# Patient Record
Sex: Female | Born: 1950 | Race: Black or African American | Hispanic: No | Marital: Single | Smoking: Current every day smoker
Health system: Southern US, Community
[De-identification: ages and names within clinical notes are randomized; demographics above are authoritative.]

## PROBLEM LIST (undated history)

## (undated) DIAGNOSIS — M1990 Unspecified osteoarthritis, unspecified site: Secondary | ICD-10-CM

## (undated) HISTORY — DX: Unspecified osteoarthritis, unspecified site: M19.90

---

## 2010-10-14 DIAGNOSIS — E785 Hyperlipidemia, unspecified: Secondary | ICD-10-CM | POA: Insufficient documentation

## 2010-10-14 DIAGNOSIS — N6320 Unspecified lump in the left breast, unspecified quadrant: Secondary | ICD-10-CM | POA: Insufficient documentation

## 2018-08-28 ENCOUNTER — Emergency Department (HOSPITAL_COMMUNITY)
Admission: EM | Admit: 2018-08-28 | Discharge: 2018-08-28 | Disposition: A | Discharge status: Home / Self Care | Payer: Medicare Other | Attending: Emergency Medicine | Admitting: Emergency Medicine

## 2018-08-28 ENCOUNTER — Other Ambulatory Visit: Payer: Self-pay

## 2018-08-28 ENCOUNTER — Encounter (HOSPITAL_COMMUNITY): Payer: Self-pay

## 2018-08-28 DIAGNOSIS — F17210 Nicotine dependence, cigarettes, uncomplicated: Secondary | ICD-10-CM | POA: Insufficient documentation

## 2018-08-28 DIAGNOSIS — M25432 Effusion, left wrist: Secondary | ICD-10-CM | POA: Diagnosis present

## 2018-08-28 MED ORDER — PREDNISONE 10 MG PO TABS: 20.0000 mg | ORAL_TABLET | Freq: Every day | ORAL | 0 refills | Status: DC

## 2018-08-28 NOTE — ED Provider Notes (Signed)
[facility] DEPT Provider Note   CSN: [id number] Arrival date & time: 08/28/18  8469     History   Chief Complaint Chief Complaint  Patient presents with  . Arm Pain    HPI Baby Gieger is a 68 y.o. female.     The history is provided by the patient. No language interpreter was used.  Arm Pain   Lezly Rumpf is a 68 y.o. female who presents to the Emergency Department complaining of wrist swelling and pain. She presents for evaluation of one week of left wrist swelling and pain. Pain has significantly worsened over the last 24 hours. She is right-hand dominant. She denies any injuries to the wrist. She has decreased range of motion difficulty using her hand secondary to swelling in the wrist. She denies any fevers, chills, shortness of breath, nausea, vomiting, abdominal pain. No prior similar symptoms. She has taken ibuprofen at home with partial improvement in her symptoms. History reviewed. No pertinent past medical history.  There are no active problems to display for this patient.   History reviewed. No pertinent surgical history.   OB History   No obstetric history on file.      Home Medications    Prior to Admission medications   Medication Sig Start Date End Date Taking? Authorizing Provider  predniSONE (DELTASONE) 10 MG tablet Take 2 tablets (20 mg total) by mouth daily. 08/28/18   Quintella Reichert, MD    Family History History reviewed. No pertinent family history.  Social History Social History   Tobacco Use  . Smoking status: Current Every Day Smoker    Packs/day: 0.50    Types: Cigarettes  . Smokeless tobacco: Never Used  . Tobacco comment: 3 packs a week   Substance Use Topics  . Alcohol use: Not Currently  . Drug use: Never     Allergies   Patient has no allergy information on record.   Review of Systems Review of Systems  All other systems reviewed and are negative.    Physical Exam Updated Vital Signs BP  114/77 (BP Location: Right Arm)   Pulse 95   Temp 99 F (37.2 C) (Oral)   Resp 17   SpO2 99%   Physical Exam Vitals signs and nursing note reviewed.  Constitutional:      Appearance: She is well-developed.  HENT:     Head: Normocephalic and atraumatic.  Cardiovascular:     Rate and Rhythm: Normal rate and regular rhythm.     Heart sounds: No murmur.  Pulmonary:     Effort: Pulmonary effort is normal. No respiratory distress.     Breath sounds: Normal breath sounds.  Musculoskeletal:     Comments: 2+ radial pulses bilaterally. There is mild to moderate edema and tenderness to the left wrist with decreased range of motion secondary to pain and swelling. There is mild swelling to the left second and third MCP joints with decreased range of motion. There is no overlying erythema. There is normal range of motion throughout the elbow and shoulder of the left upper extremity.  Skin:    General: Skin is warm and dry.  Neurological:     Mental Status: She is alert and oriented to person, place, and time.  Psychiatric:        Behavior: Behavior normal.      ED Treatments / Results  Labs (all labs ordered are listed, but only abnormal results are displayed) Labs Reviewed - No data to display  EKG  None  Radiology No results found.  Procedures Procedures (including critical care time)  Medications Ordered in ED Medications - No data to display   Initial Impression / Assessment and Plan / ED Course  I have reviewed the triage vital signs and the nursing notes.  Pertinent labs & imaging results that were available during my care of the patient were reviewed by me and considered in my medical decision making (see chart for details).        Patient here for evaluation of pain and swelling to the left wrist. She is well appearing on evaluation. No evidence of active infection, gouty arthritis, trauma. She does have edema decreased range of motion concerning for inflammatory  arthropathy. Will treat with a splint for comfort as well as a low dose of steroid course in anti-inflammatories. Counseled patient on home care, outpatient follow-up and return precautions.  Final Clinical Impressions(s) / ED Diagnoses   Final diagnoses:  Wrist swelling, left    ED Discharge Orders         Ordered    predniSONE (DELTASONE) 10 MG tablet  Daily     08/28/18 0737           Tilden Fossaees, [place], MD 08/28/18 [phone]

## 2018-08-28 NOTE — ED Triage Notes (Addendum)
Pt c/o left lower arm pain since Saturday and has progressively gotten worse.   Denies falling, injury, or trauma.   Pt states pain woke her up this morning at 2am 10/10 pain   Pain in triage is a 1.   Pain is worse with activity.   A/Ox4 Ambulatory in triage.

## 2018-10-03 ENCOUNTER — Other Ambulatory Visit: Payer: Self-pay | Admitting: Endocrinology

## 2018-10-03 DIAGNOSIS — Z1231 Encounter for screening mammogram for malignant neoplasm of breast: Secondary | ICD-10-CM

## 2018-11-20 ENCOUNTER — Other Ambulatory Visit: Payer: Self-pay

## 2018-11-20 ENCOUNTER — Ambulatory Visit
Admission: RE | Admit: 2018-11-20 | Discharge: 2018-11-20 | Disposition: A | Discharge status: Home / Self Care | Payer: Medicare Other | Source: Ambulatory Visit | Attending: Endocrinology | Admitting: Endocrinology

## 2018-11-20 DIAGNOSIS — Z1231 Encounter for screening mammogram for malignant neoplasm of breast: Secondary | ICD-10-CM

## 2019-09-24 ENCOUNTER — Other Ambulatory Visit: Payer: Self-pay | Admitting: Family Medicine

## 2019-09-24 DIAGNOSIS — M8580 Other specified disorders of bone density and structure, unspecified site: Secondary | ICD-10-CM | POA: Insufficient documentation

## 2019-09-24 DIAGNOSIS — E559 Vitamin D deficiency, unspecified: Secondary | ICD-10-CM | POA: Insufficient documentation

## 2019-09-24 DIAGNOSIS — E538 Deficiency of other specified B group vitamins: Secondary | ICD-10-CM | POA: Insufficient documentation

## 2019-09-24 DIAGNOSIS — Z122 Encounter for screening for malignant neoplasm of respiratory organs: Secondary | ICD-10-CM

## 2019-09-24 DIAGNOSIS — Z87891 Personal history of nicotine dependence: Secondary | ICD-10-CM

## 2019-09-24 DIAGNOSIS — L293 Anogenital pruritus, unspecified: Secondary | ICD-10-CM | POA: Insufficient documentation

## 2019-10-01 DIAGNOSIS — N1831 Chronic kidney disease, stage 3a: Secondary | ICD-10-CM | POA: Insufficient documentation

## 2019-10-07 ENCOUNTER — Other Ambulatory Visit: Payer: Self-pay

## 2019-10-07 ENCOUNTER — Ambulatory Visit
Admission: RE | Admit: 2019-10-07 | Discharge: 2019-10-07 | Disposition: A | Discharge status: Home / Self Care | Payer: Medicare Other | Source: Ambulatory Visit | Attending: Family Medicine | Admitting: Family Medicine

## 2019-10-07 DIAGNOSIS — Z122 Encounter for screening for malignant neoplasm of respiratory organs: Secondary | ICD-10-CM

## 2019-10-07 DIAGNOSIS — Z87891 Personal history of nicotine dependence: Secondary | ICD-10-CM

## 2019-10-29 DIAGNOSIS — F330 Major depressive disorder, recurrent, mild: Secondary | ICD-10-CM | POA: Insufficient documentation

## 2020-01-28 DIAGNOSIS — L853 Xerosis cutis: Secondary | ICD-10-CM | POA: Insufficient documentation

## 2020-01-28 DIAGNOSIS — M170 Bilateral primary osteoarthritis of knee: Secondary | ICD-10-CM | POA: Insufficient documentation

## 2020-12-02 ENCOUNTER — Other Ambulatory Visit: Payer: Self-pay | Admitting: Family Medicine

## 2020-12-02 DIAGNOSIS — Z122 Encounter for screening for malignant neoplasm of respiratory organs: Secondary | ICD-10-CM

## 2020-12-02 DIAGNOSIS — Z87891 Personal history of nicotine dependence: Secondary | ICD-10-CM

## 2020-12-03 ENCOUNTER — Other Ambulatory Visit: Payer: Self-pay | Admitting: Family Medicine

## 2020-12-03 DIAGNOSIS — Z13820 Encounter for screening for osteoporosis: Secondary | ICD-10-CM

## 2020-12-29 ENCOUNTER — Other Ambulatory Visit: Payer: Self-pay

## 2020-12-29 ENCOUNTER — Ambulatory Visit
Admission: RE | Admit: 2020-12-29 | Discharge: 2020-12-29 | Disposition: A | Discharge status: Home / Self Care | Payer: Medicare Other | Source: Ambulatory Visit | Attending: Family Medicine | Admitting: Family Medicine

## 2020-12-29 DIAGNOSIS — Z122 Encounter for screening for malignant neoplasm of respiratory organs: Secondary | ICD-10-CM

## 2020-12-29 DIAGNOSIS — Z87891 Personal history of nicotine dependence: Secondary | ICD-10-CM

## 2020-12-30 DIAGNOSIS — I700 Atherosclerosis of aorta: Secondary | ICD-10-CM | POA: Insufficient documentation

## 2020-12-30 DIAGNOSIS — I2510 Atherosclerotic heart disease of native coronary artery without angina pectoris: Secondary | ICD-10-CM | POA: Insufficient documentation

## 2020-12-31 DIAGNOSIS — R918 Other nonspecific abnormal finding of lung field: Secondary | ICD-10-CM | POA: Insufficient documentation

## 2021-03-22 ENCOUNTER — Other Ambulatory Visit: Payer: Self-pay | Admitting: Internal Medicine

## 2021-03-22 DIAGNOSIS — Z1231 Encounter for screening mammogram for malignant neoplasm of breast: Secondary | ICD-10-CM

## 2021-04-15 ENCOUNTER — Ambulatory Visit
Admission: RE | Admit: 2021-04-15 | Discharge: 2021-04-15 | Disposition: A | Discharge status: Home / Self Care | Payer: Medicare Other | Source: Ambulatory Visit | Attending: Internal Medicine | Admitting: Internal Medicine

## 2021-04-15 DIAGNOSIS — Z1231 Encounter for screening mammogram for malignant neoplasm of breast: Secondary | ICD-10-CM

## 2021-05-14 ENCOUNTER — Ambulatory Visit
Admission: RE | Admit: 2021-05-14 | Discharge: 2021-05-14 | Disposition: A | Discharge status: Home / Self Care | Payer: Medicare Other | Source: Ambulatory Visit | Attending: Family Medicine | Admitting: Family Medicine

## 2021-05-14 DIAGNOSIS — Z13820 Encounter for screening for osteoporosis: Secondary | ICD-10-CM

## 2021-05-18 DIAGNOSIS — K219 Gastro-esophageal reflux disease without esophagitis: Secondary | ICD-10-CM | POA: Insufficient documentation

## 2021-06-03 ENCOUNTER — Other Ambulatory Visit: Payer: Self-pay | Admitting: Internal Medicine

## 2021-06-03 DIAGNOSIS — R918 Other nonspecific abnormal finding of lung field: Secondary | ICD-10-CM

## 2021-06-03 DIAGNOSIS — Z122 Encounter for screening for malignant neoplasm of respiratory organs: Secondary | ICD-10-CM

## 2021-07-05 ENCOUNTER — Other Ambulatory Visit: Payer: Self-pay | Admitting: Physician Assistant

## 2021-07-08 ENCOUNTER — Ambulatory Visit
Admission: RE | Admit: 2021-07-08 | Discharge: 2021-07-08 | Disposition: A | Discharge status: Home / Self Care | Payer: Medicare Other | Source: Ambulatory Visit | Attending: Internal Medicine | Admitting: Internal Medicine

## 2021-07-08 ENCOUNTER — Other Ambulatory Visit: Payer: Self-pay | Admitting: Internal Medicine

## 2021-07-08 DIAGNOSIS — R918 Other nonspecific abnormal finding of lung field: Secondary | ICD-10-CM

## 2021-07-08 DIAGNOSIS — Z122 Encounter for screening for malignant neoplasm of respiratory organs: Secondary | ICD-10-CM

## 2022-07-18 ENCOUNTER — Other Ambulatory Visit: Payer: Self-pay | Admitting: Geriatric Medicine

## 2022-07-18 DIAGNOSIS — Z122 Encounter for screening for malignant neoplasm of respiratory organs: Secondary | ICD-10-CM

## 2022-07-18 DIAGNOSIS — Z87891 Personal history of nicotine dependence: Secondary | ICD-10-CM

## 2022-08-10 ENCOUNTER — Ambulatory Visit
Admission: RE | Admit: 2022-08-10 | Discharge: 2022-08-10 | Disposition: A | Discharge status: Home / Self Care | Payer: Medicare Other | Source: Ambulatory Visit | Attending: Geriatric Medicine | Admitting: Geriatric Medicine

## 2022-08-10 DIAGNOSIS — Z87891 Personal history of nicotine dependence: Secondary | ICD-10-CM

## 2022-08-10 DIAGNOSIS — Z122 Encounter for screening for malignant neoplasm of respiratory organs: Secondary | ICD-10-CM

## 2022-08-16 DIAGNOSIS — J432 Centrilobular emphysema: Secondary | ICD-10-CM | POA: Insufficient documentation

## 2022-08-16 DIAGNOSIS — J479 Bronchiectasis, uncomplicated: Secondary | ICD-10-CM | POA: Insufficient documentation

## 2023-07-14 ENCOUNTER — Encounter: Payer: Self-pay | Admitting: Family Medicine

## 2023-07-14 ENCOUNTER — Ambulatory Visit: Admitting: Family Medicine

## 2023-07-14 VITALS — BP 102/72 | HR 88 | Temp 97.8°F | Ht 65.0 in | Wt 190.0 lb

## 2023-07-14 DIAGNOSIS — I2510 Atherosclerotic heart disease of native coronary artery without angina pectoris: Secondary | ICD-10-CM

## 2023-07-14 DIAGNOSIS — N1831 Chronic kidney disease, stage 3a: Secondary | ICD-10-CM

## 2023-07-14 DIAGNOSIS — R7303 Prediabetes: Secondary | ICD-10-CM | POA: Diagnosis not present

## 2023-07-14 DIAGNOSIS — E785 Hyperlipidemia, unspecified: Secondary | ICD-10-CM

## 2023-07-14 DIAGNOSIS — I700 Atherosclerosis of aorta: Secondary | ICD-10-CM

## 2023-07-14 DIAGNOSIS — E559 Vitamin D deficiency, unspecified: Secondary | ICD-10-CM

## 2023-07-14 DIAGNOSIS — R011 Cardiac murmur, unspecified: Secondary | ICD-10-CM

## 2023-07-14 DIAGNOSIS — F17200 Nicotine dependence, unspecified, uncomplicated: Secondary | ICD-10-CM

## 2023-07-14 DIAGNOSIS — R252 Cramp and spasm: Secondary | ICD-10-CM

## 2023-07-14 DIAGNOSIS — J432 Centrilobular emphysema: Secondary | ICD-10-CM

## 2023-07-14 DIAGNOSIS — G8929 Other chronic pain: Secondary | ICD-10-CM

## 2023-07-14 LAB — COMPREHENSIVE METABOLIC PANEL WITH GFR
ALT: 16 U/L (ref 0–35)
AST: 20 U/L (ref 0–37)
Albumin: 4.3 g/dL (ref 3.5–5.2)
Alkaline Phosphatase: 73 U/L (ref 39–117)
BUN: 8 mg/dL (ref 6–23)
CO2: 28 meq/L (ref 19–32)
Calcium: 10 mg/dL (ref 8.4–10.5)
Chloride: 107 meq/L (ref 96–112)
Creatinine, Ser: 1.15 mg/dL (ref 0.40–1.20)
GFR: 47.28 mL/min — ABNORMAL LOW (ref >60.00)
Glucose, Bld: 93 mg/dL (ref 70–99)
Potassium: 4.3 meq/L (ref 3.5–5.1)
Sodium: 142 meq/L (ref 135–145)
Total Bilirubin: 0.5 mg/dL (ref 0.2–1.2)
Total Protein: 7.2 g/dL (ref 6.0–8.3)

## 2023-07-14 LAB — VITAMIN D 25 HYDROXY (VIT D DEFICIENCY, FRACTURES): VITD: 47.03 ng/mL (ref 30.00–100.00)

## 2023-07-14 LAB — CBC WITH DIFFERENTIAL/PLATELET
Basophils Absolute: 0.1 K/uL (ref 0.0–0.1)
Basophils Relative: 0.6 % (ref 0.0–3.0)
Eosinophils Absolute: 0.1 K/uL (ref 0.0–0.7)
Eosinophils Relative: 0.7 % (ref 0.0–5.0)
HCT: 44.8 % (ref 36.0–46.0)
Hemoglobin: 14.4 g/dL (ref 12.0–15.0)
Lymphocytes Relative: 28.2 % (ref 12.0–46.0)
Lymphs Abs: 2.5 K/uL (ref 0.7–4.0)
MCHC: 32.2 g/dL (ref 30.0–36.0)
MCV: 87.1 fl (ref 78.0–100.0)
Monocytes Absolute: 0.5 K/uL (ref 0.1–1.0)
Monocytes Relative: 5.5 % (ref 3.0–12.0)
Neutro Abs: 5.8 K/uL (ref 1.4–7.7)
Neutrophils Relative %: 65 % (ref 43.0–77.0)
Platelets: 231 K/uL (ref 150.0–400.0)
RBC: 5.14 Mil/uL — ABNORMAL HIGH (ref 3.87–5.11)
RDW: 14.7 % (ref 11.5–15.5)
WBC: 9 K/uL (ref 4.0–10.5)

## 2023-07-14 LAB — TSH: TSH: 1.93 u[IU]/mL (ref 0.35–5.50)

## 2023-07-14 LAB — HEMOGLOBIN A1C: Hgb A1c MFr Bld: 6.2 % (ref 4.6–6.5)

## 2023-07-14 LAB — FERRITIN: Ferritin: 91.8 ng/mL (ref 10.0–291.0)

## 2023-07-14 LAB — LIPID PANEL
Cholesterol: 161 mg/dL (ref 0–200)
HDL: 45 mg/dL (ref >39.00)
LDL Cholesterol: 90 mg/dL (ref 0–99)
NonHDL: 115.84
Total CHOL/HDL Ratio: 4
Triglycerides: 129 mg/dL (ref 0.0–149.0)
VLDL: 25.8 mg/dL (ref 0.0–40.0)

## 2023-07-14 LAB — T4, FREE: Free T4: 0.93 ng/dL (ref 0.60–1.60)

## 2023-07-14 LAB — VITAMIN B12: Vitamin B-12: 291 pg/mL (ref 211–911)

## 2023-07-14 NOTE — Patient Instructions (Signed)
 Please go downstairs for labs before you leave.   We will be in touch with your results.

## 2023-07-14 NOTE — Progress Notes (Unsigned)
 New Patient Office Visit  Subjective    Patient ID: Samantha Ward, female    DOB: [birth year]  Age: 73 y.o. MRN: [id number]  CC:  Chief Complaint  Patient presents with   Establish Care    Hx of knee pain, uses tylenol and Voltaren gel Hx of on and off vaginal itching. Uses gel recommended by provider.    HPI PHARRAH ROTTMAN presents to establish care Previous PCP - [facility] since 2020 and they closed May 2025.   Moved here from [place] in 2019.  She was going to [place] prior to [facility].   Prediabetes   Chronic right knee pain - takes Tylenol some days. Topical Voltaren gel   Emphysema. Smokes.  Smoked since HS. Usually only smokes when she is at home.   Depression- prays, reads her bible and listens to music.   Taking Crestor and is fasting today   Lives alone. She drives.  Divorced.  Has a son in [place]   No grandchildren  Has nieces and nephews       07/14/2023    1:08 PM  Depression screen PHQ 2/9  Decreased Interest 0  Down, Depressed, Hopeless 0  PHQ - 2 Score 0      Outpatient Encounter Medications as of 07/14/2023  Medication Sig   Cholecalciferol (VITAMIN D -3 PO) Take by mouth.   diclofenac Sodium (VOLTAREN ARTHRITIS PAIN) 1 % GEL Apply topically 4 (four) times daily.   FIBER PO Take by mouth.   MAGNESIUM PO Take by mouth.   Omega-3 Fatty Acids (OMEGA 3 PO) Take by mouth.   rosuvastatin (CRESTOR) 20 MG tablet rosuvastatin 20 mg tabs   [DISCONTINUED] predniSONE  (DELTASONE ) 10 MG tablet Take 2 tablets (20 mg total) by mouth daily.   No facility-administered encounter medications on file as of 07/14/2023.    Past Medical History:  Diagnosis Date   Arthritis    Left breast mass 10/14/2010   Mammogram normal 11/19/10.     Recurrent major depressive episodes, mild (HCC) 10/29/2019    History reviewed. No pertinent surgical history.  Family History  Problem Relation Age of Onset   Heart disease Mother    Drug abuse Sister     Social  History   Socioeconomic History   Marital status: Single    Spouse name: Not on file   Number of children: Not on file   Years of education: Not on file   Highest education level: Associate degree: academic program  Occupational History   Not on file  Tobacco Use   Smoking status: Some Days    Current packs/day: 0.50    Average packs/day: 0.5 packs/day for 60.0 years (30.0 ttl pk-yrs)    Types: Cigarettes   Smokeless tobacco: Never   Tobacco comments:    Not everyday  occurrence, sometimes can go three weeks without having a cigarette  Substance and Sexual Activity   Alcohol use: Never   Drug use: Never   Sexual activity: Not Currently    Birth control/protection: None  Other Topics Concern   Not on file  Social History Narrative   Not on file   Social Drivers of Health   Financial Resource Strain: Low Risk  (07/10/2023)   Overall Financial Resource Strain (CARDIA)    Difficulty of Paying Living Expenses: Not very hard  Food Insecurity: Food Insecurity Present (07/10/2023)   Hunger Vital Sign    Worried About Running Out of Food in the Last Year: Sometimes true  Ran Out of Food in the Last Year: Sometimes true  Transportation Needs: Patient Declined (07/10/2023)   PRAPARE - Transportation    Lack of Transportation (Medical): Patient declined    Lack of Transportation (Non-Medical): Patient declined  Physical Activity: Unknown (07/10/2023)   Exercise Vital Sign    Days of Exercise per Week: Patient declined    Minutes of Exercise per Session: Not on file  Stress: No Stress Concern Present (07/10/2023)   [employer] of Occupational Health - Occupational Stress Questionnaire    Feeling of Stress: Only a little  Social Connections: Unknown (07/10/2023)   Social Connection and Isolation Panel    Frequency of Communication with Friends and Family: Twice a week    Frequency of Social Gatherings with Friends and Family: Patient declined    Attends Religious Services: Patient  declined    Database administrator or Organizations: Patient declined    Attends Engineer, structural: Not on file    Marital Status: Patient declined  Intimate Partner Violence: Not on file    Review of Systems  Constitutional:  Negative for chills and fever.  Eyes:  Negative for blurred vision, double vision and photophobia.  Respiratory:  Negative for shortness of breath.   Cardiovascular:  Negative for chest pain, palpitations and leg swelling.  Gastrointestinal:  Negative for abdominal pain, constipation, diarrhea, nausea and vomiting.  Genitourinary:  Negative for dysuria, frequency and urgency.  Musculoskeletal:  Negative for falls.  Neurological:  Negative for dizziness, focal weakness and headaches.  Psychiatric/Behavioral:  Negative for depression and substance abuse. The patient is not nervous/anxious.         Objective    BP 102/72 (BP Location: Left Arm, Patient Position: Sitting)   Pulse 88   Temp 97.8 F (36.6 C) (Temporal)   Ht 5' 5 (1.651 m)   Wt 190 lb (86.2 kg)   SpO2 98%   BMI 31.62 kg/m   Physical Exam Constitutional:      General: She is not in acute distress.    Appearance: She is not ill-appearing.  HENT:     Mouth/Throat:     Mouth: Mucous membranes are moist.     Pharynx: Oropharynx is clear.  Eyes:     Extraocular Movements: Extraocular movements intact.     Conjunctiva/sclera: Conjunctivae normal.  Cardiovascular:     Rate and Rhythm: Normal rate and regular rhythm.     Heart sounds: Murmur heard.  Pulmonary:     Effort: Pulmonary effort is normal.     Breath sounds: Normal breath sounds.  Musculoskeletal:     Cervical back: Normal range of motion and neck supple.     Right lower leg: No edema.     Left lower leg: No edema.  Skin:    General: Skin is warm and dry.  Neurological:     General: No focal deficit present.     Mental Status: She is alert and oriented to person, place, and time.     Cranial Nerves: No cranial  nerve deficit.     Motor: No weakness.     Coordination: Coordination normal.     Gait: Gait normal.  Psychiatric:        Mood and Affect: Mood normal.        Behavior: Behavior normal.        Thought Content: Thought content normal.         Assessment & Plan:   Problem List Items Addressed This Visit  Hyperlipidemia (Chronic)   Relevant Medications   rosuvastatin (CRESTOR) 20 MG tablet   Other Relevant Orders   Lipid panel (Completed)   Calcification of coronary artery   Relevant Medications   rosuvastatin (CRESTOR) 20 MG tablet   Other Relevant Orders   Lipid panel (Completed)   Stage 3a chronic kidney disease (HCC)   Relevant Orders   Comprehensive metabolic panel with GFR (Completed)   TSH (Completed)   T4, free (Completed)   Vitamin D  deficiency   Relevant Orders   VITAMIN D  25 Hydroxy (Vit-D Deficiency, Fractures) (Completed)   Other Visit Diagnoses       Prediabetes    -  Primary   Relevant Orders   CBC with Differential/Platelet (Completed)   Comprehensive metabolic panel with GFR (Completed)   TSH (Completed)   T4, free (Completed)   Hemoglobin A1c (Completed)     Centrilobular emphysema (HCC)         Chronic right hip pain         Chronic pain of right knee         Atherosclerosis of aorta (HCC)       Relevant Medications   rosuvastatin (CRESTOR) 20 MG tablet   Other Relevant Orders   Lipid panel (Completed)     Muscle cramps       Relevant Orders   CBC with Differential/Platelet (Completed)   Comprehensive metabolic panel with GFR (Completed)   TSH (Completed)   T4, free (Completed)   Vitamin B12 (Completed)   Ferritin (Completed)     Smoker         Heart murmur          She is here to establish care.  Previous medical care at [facility] in [place] prior to that. Reviewed list of chronic health conditions and medications.  Reports taking statin therapy.  Unclear if murmur is ongoing or not.  Order echocardiogram if I am able to get  records and no recent echo on file. Encouraged smoking cessation. Check labs to look for underlying etiology for muscle cramps as well as to follow-up on chronic health conditions such as prediabetes, CKD, vitamin D  deficiency and hyperlipidemia.  Follow-up in 4 weeks     Return in about 4 weeks (around 08/11/2023) for chronic health conditions.   Boby Mackintosh, NP-C

## 2023-07-17 ENCOUNTER — Ambulatory Visit: Payer: Self-pay | Admitting: Family Medicine

## 2023-08-10 IMAGING — MG MM DIGITAL SCREENING BILAT W/ TOMO AND CAD
6 of 10 series · 6 of 30 positions shown · non-contrast
Comparison: Previous exam(s).

CLINICAL DATA: Screening.

EXAM:
DIGITAL SCREENING BILATERAL MAMMOGRAM WITH TOMOSYNTHESIS AND CAD
TECHNIQUE: Bilateral screening digital craniocaudal and mediolateral oblique
mammograms were obtained. Bilateral screening digital breast
tomosynthesis was performed. The images were evaluated with
computer-aided detection.

[R CC synth-2D]
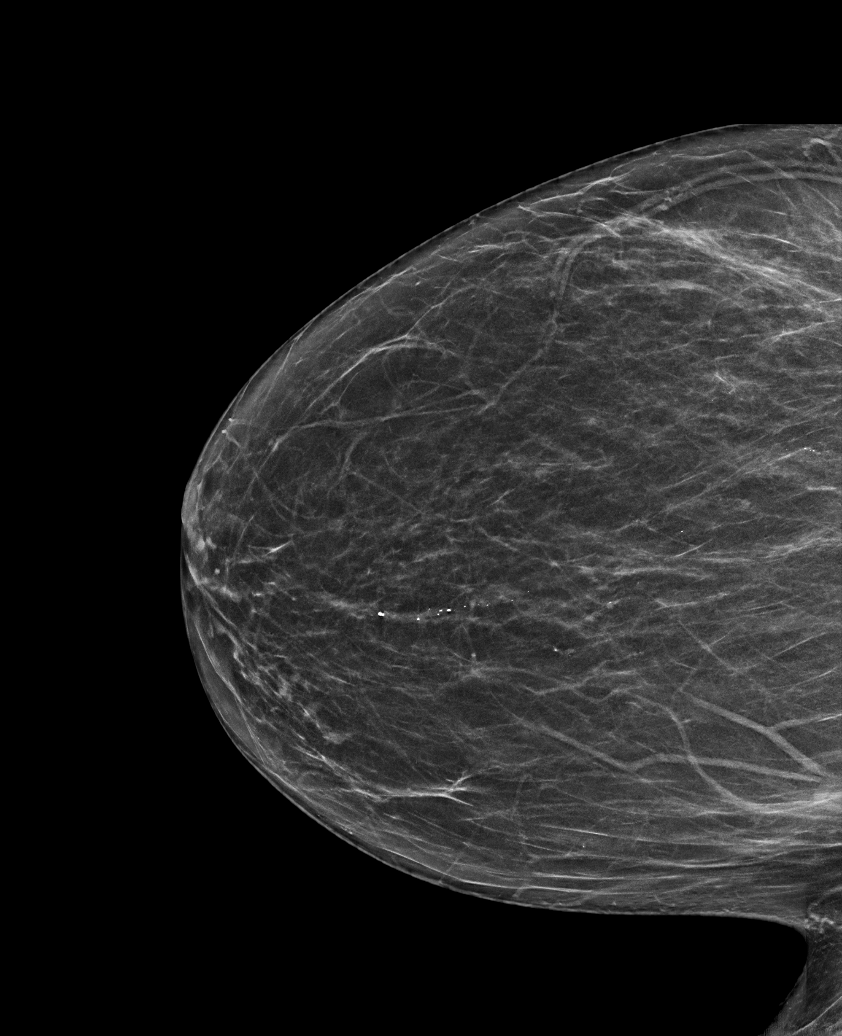

[L MLO synth-2D]
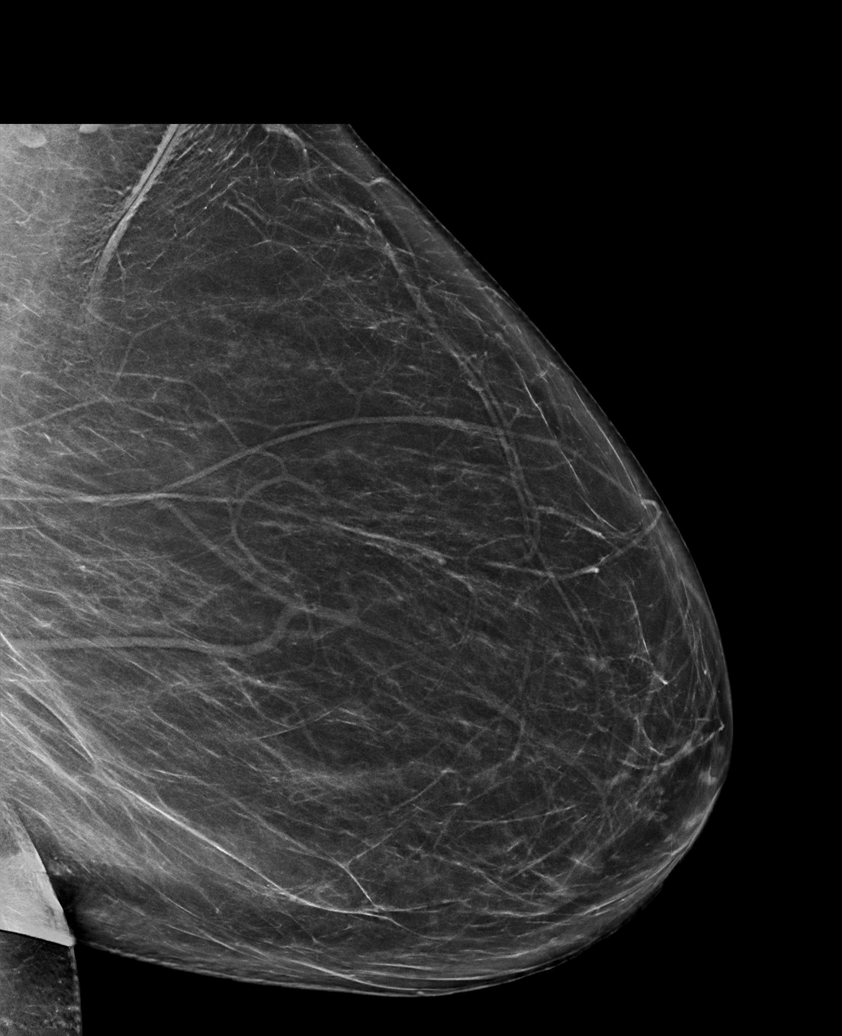

[R MLO synth-2D (1 of 2)]
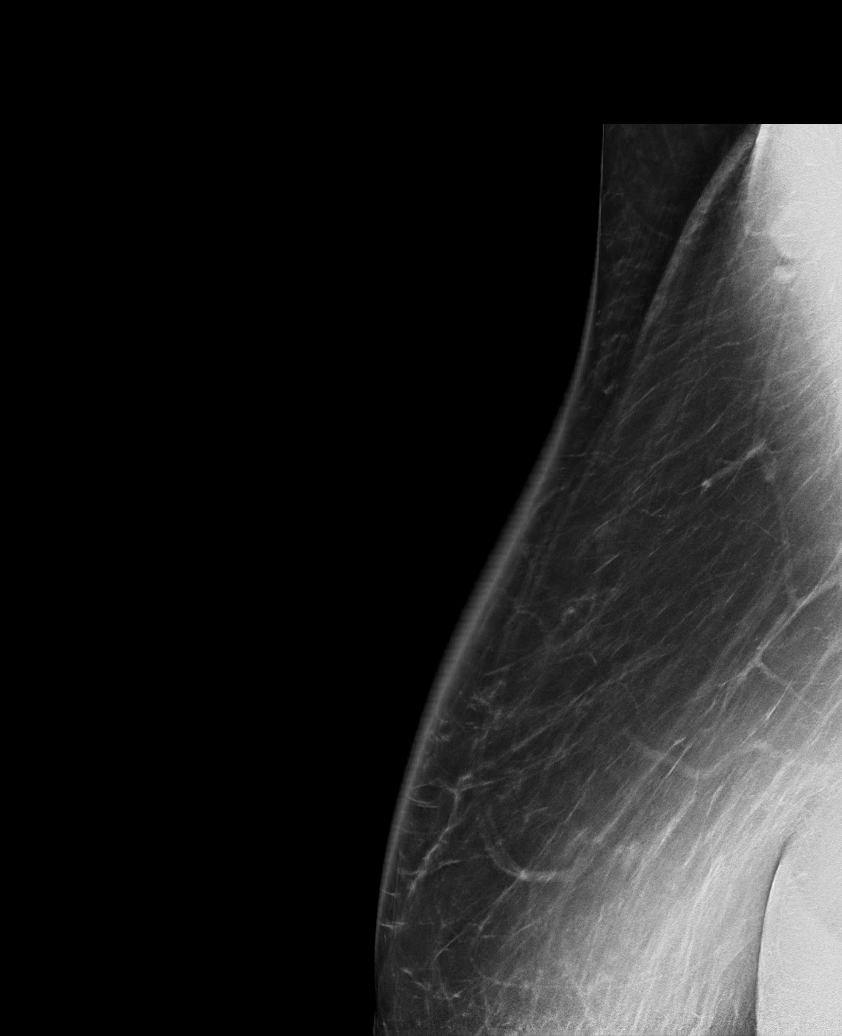

[L CC synth-2D]
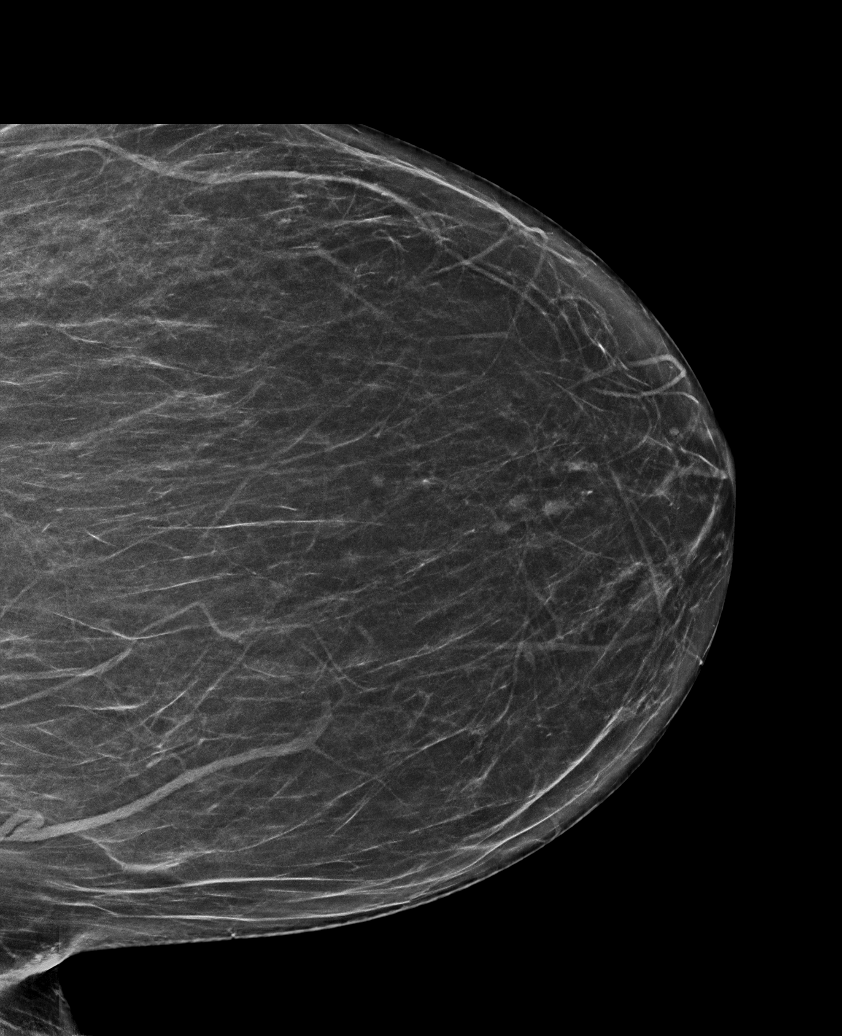

[R MLO synth-2D (2 of 2)]
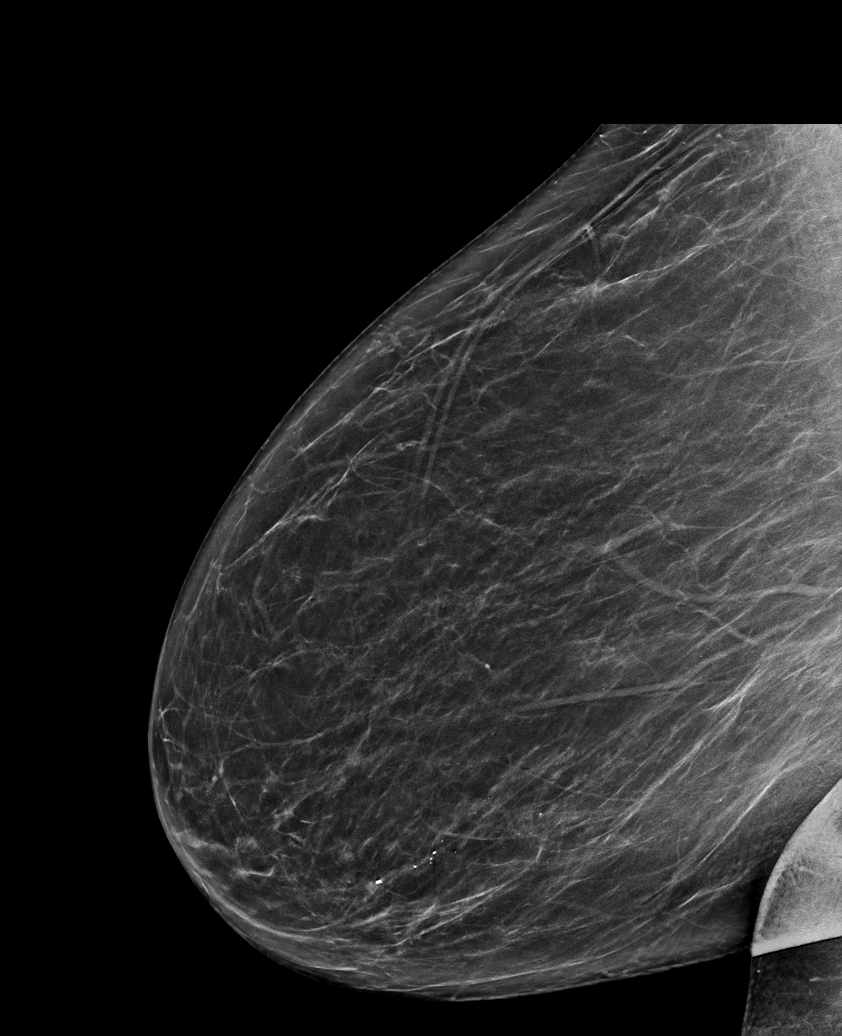

[L MLO tomo · tomo slice 38/75.0]
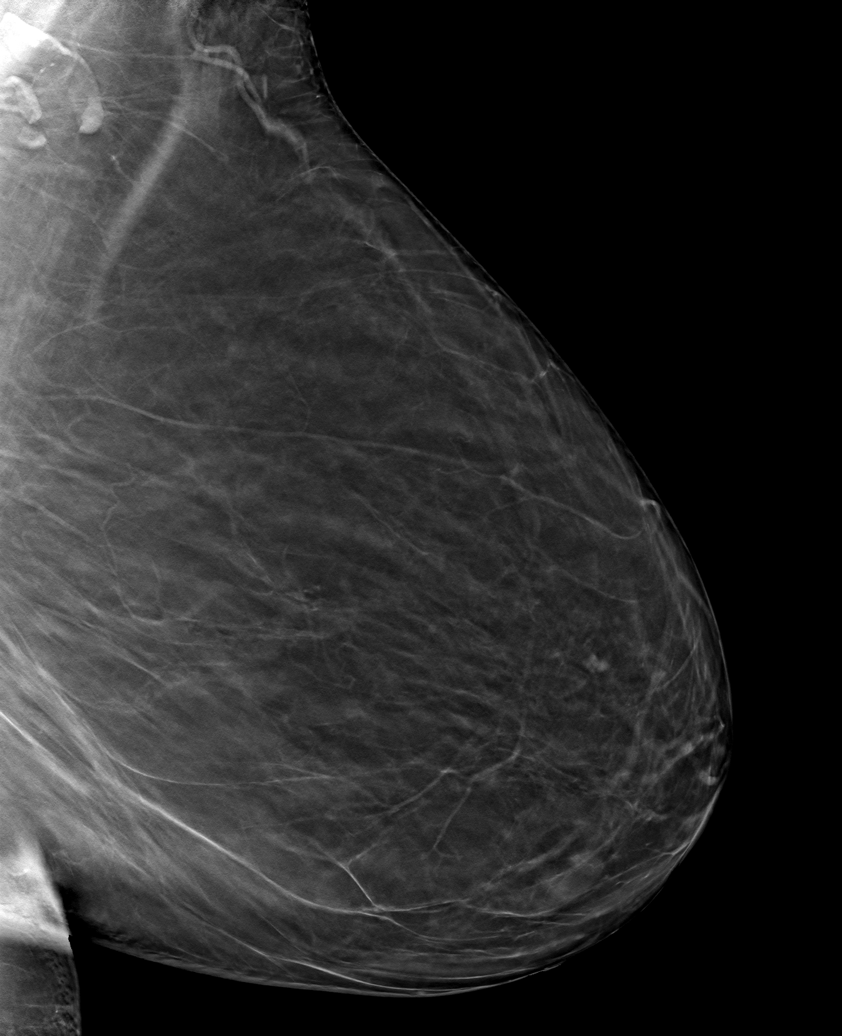

[6 of 30 positions shown; findings below may reference images not displayed]

ACR Breast Density Category b: There are scattered areas of
fibroglandular density.
FINDINGS: There are no findings suspicious for malignancy.
IMPRESSION: No mammographic evidence of malignancy. A result letter of this
screening mammogram will be mailed directly to the patient.

RECOMMENDATION:
Screening mammogram in one year. (Code:51-O-LD2)

BI-RADS CATEGORY  1: Negative.

## 2023-08-11 ENCOUNTER — Ambulatory Visit: Admitting: Family Medicine

## 2023-08-16 ENCOUNTER — Telehealth: Payer: Self-pay | Admitting: Family Medicine

## 2023-08-16 NOTE — Telephone Encounter (Signed)
 Contacted Samantha Ward to schedule their annual wellness visit. Patient declined to schedule AWV at this time.Transferred care to Dr. Lendia.   [facility] Care Guide [facility] AWV TEAM Direct Dial: [phone]
